# Patient Record
Sex: Male | Born: 1995 | Hispanic: No | Marital: Single | Smoking: Never smoker
Health system: Southern US, Community
[De-identification: ages and names within clinical notes are randomized; demographics above are authoritative.]

## PROBLEM LIST (undated history)

## (undated) DIAGNOSIS — R7303 Prediabetes: Secondary | ICD-10-CM

## (undated) DIAGNOSIS — I10 Essential (primary) hypertension: Secondary | ICD-10-CM

## (undated) DIAGNOSIS — E7800 Pure hypercholesterolemia, unspecified: Secondary | ICD-10-CM

## (undated) HISTORY — PX: APPENDECTOMY: SHX54

---
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
## 2013-04-23 ENCOUNTER — Emergency Department (HOSPITAL_BASED_OUTPATIENT_CLINIC_OR_DEPARTMENT_OTHER)
Admission: EM | Admit: 2013-04-23 | Discharge: 2013-04-23 | Disposition: A | Discharge status: Home / Self Care | Payer: Medicaid Other | Attending: Emergency Medicine | Admitting: Emergency Medicine

## 2013-04-23 ENCOUNTER — Encounter (HOSPITAL_BASED_OUTPATIENT_CLINIC_OR_DEPARTMENT_OTHER): Payer: Self-pay

## 2013-04-23 ENCOUNTER — Emergency Department (HOSPITAL_BASED_OUTPATIENT_CLINIC_OR_DEPARTMENT_OTHER): Payer: Medicaid Other

## 2013-04-23 DIAGNOSIS — I10 Essential (primary) hypertension: Secondary | ICD-10-CM | POA: Insufficient documentation

## 2013-04-23 DIAGNOSIS — R350 Frequency of micturition: Secondary | ICD-10-CM | POA: Insufficient documentation

## 2013-04-23 DIAGNOSIS — K5900 Constipation, unspecified: Secondary | ICD-10-CM | POA: Insufficient documentation

## 2013-04-23 DIAGNOSIS — R109 Unspecified abdominal pain: Secondary | ICD-10-CM | POA: Insufficient documentation

## 2013-04-23 LAB — BASIC METABOLIC PANEL
BUN: 11 mg/dL (ref 6–23)
Chloride: 102 mEq/L (ref 96–112)
GFR calc non Af Amer: >90 mL/min (ref >90)
Glucose, Bld: 153 mg/dL — ABNORMAL HIGH (ref 70–99)
Potassium: 3.8 mEq/L (ref 3.5–5.1)
Sodium: 139 mEq/L (ref 135–145)

## 2013-04-23 LAB — URINALYSIS, ROUTINE W REFLEX MICROSCOPIC
Bilirubin Urine: NEGATIVE
Glucose, UA: NEGATIVE mg/dL
Hgb urine dipstick: NEGATIVE
Specific Gravity, Urine: 1.004 — ABNORMAL LOW (ref 1.005–1.030)
Urobilinogen, UA: 0.2 mg/dL (ref 0.0–1.0)
pH: 5.5 (ref 5.0–8.0)

## 2013-04-23 NOTE — ED Provider Notes (Signed)
CSN: [id number]     Arrival date & time 04/23/13  1603 History     First MD Initiated Contact with Patient 04/23/13 1612     Chief Complaint  Patient presents with  . Abdominal Pain   (Consider location/radiation/quality/duration/timing/severity/associated sxs/prior Treatment) Patient is a 17 y.o. male presenting with abdominal pain. The history is provided by the patient.  Abdominal Pain Pain location:  Suprapubic Pain quality: aching   Pain radiates to:  Does not radiate Pain severity:  Moderate Onset quality:  Gradual Duration:  3 days Timing:  Intermittent Progression:  Worsening Chronicity:  New Relieved by:  None tried Worsened by:  Urination Ineffective treatments:  None tried Associated symptoms: constipation   Associated symptoms: no chills, no diarrhea, no dysuria, no fever, no hematuria, no nausea and no vomiting     Past Medical History  Diagnosis Date  . Hypertension    Past Surgical History  Procedure Laterality Date  . Appendectomy     No family history on file. History  Substance Use Topics  . Smoking status: Never Smoker   . Smokeless tobacco: Not on file  . Alcohol Use: No    Review of Systems  Constitutional: Negative for fever and chills.  Gastrointestinal: Positive for abdominal pain and constipation. Negative for nausea, vomiting and diarrhea.  Genitourinary: Positive for frequency. Negative for dysuria, hematuria, decreased urine volume, discharge, scrotal swelling and penile pain.  Musculoskeletal: Negative for back pain.  All other systems reviewed and are negative.    Allergies  Review of patient's allergies indicates no known allergies.  Home Medications  No current outpatient prescriptions on file. BP 152/73  Pulse 98  Temp(Src) 98.2 F (36.8 C) (Oral)  Resp 18  Ht 6\' 2"  (1.88 m)  Wt 240 lb (108.863 kg)  BMI 30.8 kg/m2  SpO2 99% Physical Exam  Nursing note and vitals reviewed. Constitutional: He is oriented to person,  place, and time. He appears well-developed and well-nourished.  HENT:  Head: Normocephalic and atraumatic.  Right Ear: External ear normal.  Left Ear: External ear normal.  Nose: Nose normal.  Eyes: Right eye exhibits no discharge. Left eye exhibits no discharge.  Neck: Neck supple.  Cardiovascular: Normal rate, regular rhythm, normal heart sounds and intact distal pulses.   Pulmonary/Chest: Effort normal.  Abdominal: Soft. There is tenderness (mild) in the suprapubic area. There is no rebound, no guarding and no CVA tenderness. Hernia confirmed negative in the right inguinal area and confirmed negative in the left inguinal area.  Genitourinary: Testes normal and penis normal. Cremasteric reflex is present. Right testis shows no swelling and no tenderness. Left testis shows no swelling and no tenderness. Circumcised. No penile tenderness.  Musculoskeletal: He exhibits no edema.  Neurological: He is alert and oriented to person, place, and time.  Skin: Skin is warm and dry.    ED Course   Procedures (including critical care time)  Labs Reviewed  URINALYSIS, ROUTINE W REFLEX MICROSCOPIC - Abnormal; Notable for the following:    Specific Gravity, Urine 1.004 (*)    All other components within normal limits  BASIC METABOLIC PANEL - Abnormal; Notable for the following:    Glucose, Bld 153 (*)    All other components within normal limits  GLUCOSE, CAPILLARY - Abnormal; Notable for the following:    Glucose-Capillary 131 (*)    All other components within normal limits   Dg Abd Acute W/chest  04/23/2013   *RADIOLOGY REPORT*  Clinical Data: Abdominal pain  ACUTE  ABDOMEN SERIES (ABDOMEN 2 VIEW & CHEST 1 VIEW)  Comparison: None.  Findings: Normal heart size.  Clear lungs.  No pneumothorax.    C7 cervical ribs are suspected.  There is no free intraperitoneal gas.  No disproportionate dilatation of bowel.  No air fluid levels.  Unremarkable soft tissues.  IMPRESSION: Nonobstructive bowel gas  pattern.  No free intraperitoneal gas.  No active cardiopulmonary disease.   Original Report Authenticated By: Jolaine Click, M.D.   1. Abdominal pain     MDM  Benign abd on exam, no CVA tenderness. No systemic sx. When questioned with mom out of room, patient states he has not had any intercourse or penile discharge. Declined any pain meds in ED. UA shows no infection. GU exam benign. As there is no UTI, labs and xray were evaluated. No signs of kidney injury or diabetes. Patient's abdominal exam remained soft and non-focal. No sign of any infection to treat. He is or his appendix out. No signs of any surgical cause of this pain. We will treat constipation symptomatically and will follow up with PCP in the next one to 2 days if still having pain.  Audree Camel, MD 04/24/13 [phone]

## 2013-04-23 NOTE — ED Notes (Signed)
Pt reports lower mid abdomen pain - points to suprapubic area, that started 3 days ago.  Denies dysuria, n/v or diarrhea.

## 2013-04-23 NOTE — ED Notes (Signed)
D/c home with family- no new rx given 

## 2014-01-07 ENCOUNTER — Emergency Department (HOSPITAL_BASED_OUTPATIENT_CLINIC_OR_DEPARTMENT_OTHER): Payer: BC Managed Care – PPO

## 2014-01-07 ENCOUNTER — Emergency Department (HOSPITAL_BASED_OUTPATIENT_CLINIC_OR_DEPARTMENT_OTHER)
Admission: EM | Admit: 2014-01-07 | Discharge: 2014-01-07 | Disposition: A | Discharge status: Home / Self Care | Payer: BC Managed Care – PPO | Attending: Emergency Medicine | Admitting: Emergency Medicine

## 2014-01-07 ENCOUNTER — Encounter (HOSPITAL_BASED_OUTPATIENT_CLINIC_OR_DEPARTMENT_OTHER): Payer: Self-pay | Admitting: Emergency Medicine

## 2014-01-07 DIAGNOSIS — I10 Essential (primary) hypertension: Secondary | ICD-10-CM

## 2014-01-07 DIAGNOSIS — R0789 Other chest pain: Secondary | ICD-10-CM

## 2014-01-07 DIAGNOSIS — R071 Chest pain on breathing: Secondary | ICD-10-CM | POA: Insufficient documentation

## 2014-01-07 LAB — RAPID URINE DRUG SCREEN, HOSP PERFORMED
AMPHETAMINES: NOT DETECTED
Barbiturates: NOT DETECTED
Benzodiazepines: NOT DETECTED
Cocaine: NOT DETECTED
Opiates: NOT DETECTED
Tetrahydrocannabinol: NOT DETECTED

## 2014-01-07 LAB — CBC WITH DIFFERENTIAL/PLATELET
Basophils Absolute: 0.1 10*3/uL (ref 0.0–0.1)
Basophils Relative: 1 % (ref 0–1)
EOS PCT: 2 % (ref 0–5)
Eosinophils Absolute: 0.2 10*3/uL (ref 0.0–0.7)
HCT: 45.9 % (ref 39.0–52.0)
Hemoglobin: 16.2 g/dL (ref 13.0–17.0)
LYMPHS ABS: 3.7 10*3/uL (ref 0.7–4.0)
LYMPHS PCT: 31 % (ref 12–46)
MCH: 31.2 pg (ref 26.0–34.0)
MCHC: 35.3 g/dL (ref 30.0–36.0)
MCV: 88.4 fL (ref 78.0–100.0)
Monocytes Absolute: 1.3 10*3/uL — ABNORMAL HIGH (ref 0.1–1.0)
Monocytes Relative: 11 % (ref 3–12)
NEUTROS ABS: 6.7 10*3/uL (ref 1.7–7.7)
Neutrophils Relative %: 56 % (ref 43–77)
PLATELETS: 221 10*3/uL (ref 150–400)
RBC: 5.19 MIL/uL (ref 4.22–5.81)
RDW: 12.1 % (ref 11.5–15.5)
WBC: 11.9 10*3/uL — AB (ref 4.0–10.5)

## 2014-01-07 LAB — BASIC METABOLIC PANEL
BUN: 7 mg/dL (ref 6–23)
CALCIUM: 9.8 mg/dL (ref 8.4–10.5)
CHLORIDE: 103 meq/L (ref 96–112)
CO2: 25 meq/L (ref 19–32)
Creatinine, Ser: 0.9 mg/dL (ref 0.50–1.35)
GFR calc Af Amer: >90 mL/min (ref >90)
GFR calc non Af Amer: >90 mL/min (ref >90)
GLUCOSE: 126 mg/dL — AB (ref 70–99)
POTASSIUM: 3.8 meq/L (ref 3.7–5.3)
SODIUM: 142 meq/L (ref 137–147)

## 2014-01-07 LAB — D-DIMER, QUANTITATIVE (NOT AT ARMC)

## 2014-01-07 LAB — TROPONIN I: Troponin I: <0.3 ng/mL (ref <0.30)

## 2014-01-07 MED ORDER — KETOROLAC TROMETHAMINE 60 MG/2ML IM SOLN
60.0000 mg | Freq: Once | INTRAMUSCULAR | Status: AC
  Administered 2014-01-07: 60 mg via INTRAMUSCULAR
  Filled 2014-01-07: qty 2

## 2014-01-07 MED ORDER — GI COCKTAIL ~~LOC~~
30.0000 mL | Freq: Once | ORAL | Status: AC
  Administered 2014-01-07: 30 mL via ORAL
  Filled 2014-01-07: qty 30

## 2014-01-07 NOTE — ED Notes (Signed)
Pt reports laying in bed and feeling pain in his entire chest.  Denies N/V/SOB.

## 2014-01-07 NOTE — Discharge Instructions (Signed)
Chest Wall Pain Chest wall pain is pain in or around the bones and muscles of your chest. It may take up to 6 weeks to get better. It may take longer if you must stay physically active in your work and activities.  CAUSES  Chest wall pain may happen on its own. However, it may be caused by:  A viral illness like the flu.  Injury.  Coughing.  Exercise.  Arthritis.  Fibromyalgia.  Shingles. HOME CARE INSTRUCTIONS   Avoid overtiring physical activity. Try not to strain or perform activities that cause pain. This includes any activities using your chest or your abdominal and side muscles, especially if heavy weights are used.  Put ice on the sore area.  Put ice in a plastic bag.  Place a towel between your skin and the bag.  Leave the ice on for 15-20 minutes per hour while awake for the first 2 days.  Only take over-the-counter or prescription medicines for pain, discomfort, or fever as directed by your caregiver. SEEK IMMEDIATE MEDICAL CARE IF:   Your pain increases, or you are very uncomfortable.  You have a fever.  Your chest pain becomes worse.  You have new, unexplained symptoms.  You have nausea or vomiting.  You feel sweaty or lightheaded.  You have a cough with phlegm (sputum), or you cough up blood. MAKE SURE YOU:   Understand these instructions.  Will watch your condition.  Will get help right away if you are not doing well or get worse. Document Released: 08/29/2005 Document Revised: 11/21/2011 Document Reviewed: 04/25/2011 [facility] Patient Information 2014 [place], [place].  Arterial Hypertension Arterial hypertension (high blood pressure) is a condition of elevated pressure in your blood vessels. Hypertension over a long period of time is a risk factor for strokes, heart attacks, and heart failure. It is also the leading cause of kidney (renal) failure.  CAUSES   In Adults -- Over 90% of all hypertension has no known cause. This is called essential  or primary hypertension. In the other 10% of people with hypertension, the increase in blood pressure is caused by another disorder. This is called secondary hypertension. Important causes of secondary hypertension are:  Heavy alcohol use.  Obstructive sleep apnea.  Hyperaldosterosim (Conn's syndrome).  Steroid use.  Chronic kidney failure.  Hyperparathyroidism.  Medications.  Renal artery stenosis.  Pheochromocytoma.  Cushing's disease.  Coarctation of the aorta.  Scleroderma renal crisis.  Licorice (in excessive amounts).  Drugs (cocaine, methamphetamine). Your caregiver can explain any items above that apply to you.  In Children -- Secondary hypertension is more common and should always be considered.  Pregnancy -- Few women of childbearing age have high blood pressure. However, up to 10% of them develop hypertension of pregnancy. Generally, this will not harm the woman. It may be a sign of 3 complications of pregnancy: preeclampsia, HELLP syndrome, and eclampsia. Follow up and control with medication is necessary. SYMPTOMS   This condition normally does not produce any noticeable symptoms. It is usually found during a routine exam.  Malignant hypertension is a late problem of high blood pressure. It may have the following symptoms:  Headaches.  Blurred vision.  End-organ damage (this means your kidneys, heart, lungs, and other organs are being damaged).  Stressful situations can increase the blood pressure. If a person with normal blood pressure has their blood pressure go up while being seen by their caregiver, this is often termed "white coat hypertension." Its importance is not known. It may be  related with eventually developing hypertension or complications of hypertension.  Hypertension is often confused with mental tension, stress, and anxiety. DIAGNOSIS  The diagnosis is made by 3 separate blood pressure measurements. They are taken at least 1 week apart  from each other. If there is organ damage from hypertension, the diagnosis may be made without repeat measurements. Hypertension is usually identified by having blood pressure readings:  Above 140/90 mmHg measured in both arms, at 3 separate times, over a couple weeks.  Over 130/80 mmHg should be considered a risk factor and may require treatment in patients with diabetes. Blood pressure readings over 120/80 mmHg are called "pre-hypertension" even in non-diabetic patients. To get a true blood pressure measurement, use the following guidelines. Be aware of the factors that can alter blood pressure readings.  Take measurements at least 1 hour after caffeine.  Take measurements 30 minutes after smoking and without any stress. This is another reason to quit smoking  it raises your blood pressure.  Use a proper cuff size. Ask your caregiver if you are not sure about your cuff size.  Most home blood pressure cuffs are automatic. They will measure systolic and diastolic pressures. The systolic pressure is the pressure reading at the start of sounds. Diastolic pressure is the pressure at which the sounds disappear. If you are elderly, measure pressures in multiple postures. Try sitting, lying or standing.  Sit at rest for a minimum of 5 minutes before taking measurements.  You should not be on any medications like decongestants. These are found in many cold medications.  Record your blood pressure readings and review them with your caregiver. If you have hypertension:  Your caregiver may do tests to be sure you do not have secondary hypertension (see "causes" above).  Your caregiver may also look for signs of metabolic syndrome. This is also called Syndrome X or Insulin Resistance Syndrome. You may have this syndrome if you have type 2 diabetes, abdominal obesity, and abnormal blood lipids in addition to hypertension.  Your caregiver will take your medical and family history and perform a  physical exam.  Diagnostic tests may include blood tests (for glucose, cholesterol, potassium, and kidney function), a urinalysis, or an EKG. Other tests may also be necessary depending on your condition. PREVENTION  There are important lifestyle issues that you can adopt to reduce your chance of developing hypertension:  Maintain a normal weight.  Limit the amount of salt (sodium) in your diet.  Exercise often.  Limit alcohol intake.  Get enough potassium in your diet. Discuss specific advice with your caregiver.  Follow a DASH diet (dietary approaches to stop hypertension). This diet is rich in fruits, vegetables, and low-fat dairy products, and avoids certain fats. PROGNOSIS  Essential hypertension cannot be cured. Lifestyle changes and medical treatment can lower blood pressure and reduce complications. The prognosis of secondary hypertension depends on the underlying cause. Many people whose hypertension is controlled with medicine or lifestyle changes can live a normal, healthy life.  RISKS AND COMPLICATIONS  While high blood pressure alone is not an illness, it often requires treatment due to its short- and long-term effects on many organs. Hypertension increases your risk for:  CVAs or strokes (cerebrovascular accident).  Heart failure due to chronically high blood pressure (hypertensive cardiomyopathy).  Heart attack (myocardial infarction).  Damage to the retina (hypertensive retinopathy).  Kidney failure (hypertensive nephropathy). Your caregiver can explain list items above that apply to you. Treatment of hypertension can significantly reduce the risk  of complications. TREATMENT   For overweight patients, weight loss and regular exercise are recommended. Physical fitness lowers blood pressure.  Mild hypertension is usually treated with diet and exercise. A diet rich in fruits and vegetables, fat-free dairy products, and foods low in fat and salt (sodium) can help lower  blood pressure. Decreasing salt intake decreases blood pressure in a 1/3 of people.  Stop smoking if you are a smoker. The steps above are highly effective in reducing blood pressure. While these actions are easy to suggest, they are difficult to achieve. Most patients with moderate or severe hypertension end up requiring medications to bring their blood pressure down to a normal level. There are several classes of medications for treatment. Blood pressure pills (antihypertensives) will lower blood pressure by their different actions. Lowering the blood pressure by 10 mmHg may decrease the risk of complications by as much as 25%. The goal of treatment is effective blood pressure control. This will reduce your risk for complications. Your caregiver will help you determine the best treatment for you according to your lifestyle. What is excellent treatment for one person, may not be for you. HOME CARE INSTRUCTIONS   Do not smoke.  Follow the lifestyle changes outlined in the "Prevention" section.  If you are on medications, follow the directions carefully. Blood pressure medications must be taken as prescribed. Skipping doses reduces their benefit. It also puts you at risk for problems.  Follow up with your caregiver, as directed.  If you are asked to monitor your blood pressure at home, follow the guidelines in the "Diagnosis" section above. SEEK MEDICAL CARE IF:   You think you are having medication side effects.  You have recurrent headaches or lightheadedness.  You have swelling in your ankles.  You have trouble with your vision. SEEK IMMEDIATE MEDICAL CARE IF:   You have sudden onset of chest pain or pressure, difficulty breathing, or other symptoms of a heart attack.  You have a severe headache.  You have symptoms of a stroke (such as sudden weakness, difficulty speaking, difficulty walking). MAKE SURE YOU:   Understand these instructions.  Will watch your condition.  Will  get help right away if you are not doing well or get worse. Document Released: 08/29/2005 Document Revised: 11/21/2011 Document Reviewed: 03/29/2007 [facility] Patient Information 2014 [place]ExitCare, [place]LL[place].

## 2014-01-07 NOTE — ED Provider Notes (Signed)
CSN: [id number]     Arrival date & time 01/07/14  0348 History   First MD Initiated Contact with Patient 01/07/14 [phone]     Chief Complaint  Patient presents with  . Chest Pain     (Consider location/radiation/quality/duration/timing/severity/associated sxs/prior Treatment) Patient is a 18 y.o. male presenting with chest pain. The history is provided by the patient.  Chest Pain Pain quality: dull   Pain radiates to:  Does not radiate Pain radiates to the back: no   Pain severity:  Mild Onset quality:  Gradual Timing:  Constant Progression:  Unchanged Chronicity:  New Context: not breathing, no stress and no trauma   Relieved by:  Nothing Worsened by:  Nothing tried Associated symptoms: no abdominal pain, no altered mental status, no back pain, no cough, no diaphoresis, no fever, no lower extremity edema, no nausea, no numbness, no orthopnea, no palpitations, no PND, no shortness of breath, no syncope and not vomiting   Risk factors: no immobilization, no prior DVT/PE and no surgery   No swelling or pain in the lower extremities.  No long car trips or plane trips.  No DOE no SOB.  Ate a pop tart before symptoms started  Past Medical History  Diagnosis Date  . Hypertension    Past Surgical History  Procedure Laterality Date  . Appendectomy     History reviewed. No pertinent family history. History  Substance Use Topics  . Smoking status: Never Smoker   . Smokeless tobacco: Not on file  . Alcohol Use: No    Review of Systems  Constitutional: Negative for fever and diaphoresis.  Respiratory: Negative for cough and shortness of breath.   Cardiovascular: Positive for chest pain. Negative for palpitations, orthopnea, leg swelling, syncope and PND.  Gastrointestinal: Negative for nausea, vomiting and abdominal pain.  Musculoskeletal: Negative for back pain.  Neurological: Negative for numbness.  All other systems reviewed and are negative.     Allergies  Review of  patient's allergies indicates no known allergies.  Home Medications   Prior to Admission medications   Not on File   BP 156/81  Pulse 96  Temp(Src) 98.2 F (36.8 C) (Oral)  Resp 20  Ht 6\' 1"  (1.854 m)  Wt 275 lb (124.739 kg)  BMI 36.29 kg/m2  SpO2 97% Physical Exam  Constitutional: He is oriented to person, place, and time. He appears well-developed and well-nourished. No distress.  HENT:  Head: Normocephalic and atraumatic.  Mouth/Throat: Oropharynx is clear and moist.  Eyes: Conjunctivae are normal. Pupils are equal, round, and reactive to light.  Neck: Normal range of motion. Neck supple.  Cardiovascular: Normal rate, regular rhythm and intact distal pulses.   Pulmonary/Chest: Effort normal and breath sounds normal. No respiratory distress. He has no wheezes. He has no rales. He exhibits no tenderness.  Abdominal: Soft. Bowel sounds are normal. There is no tenderness. There is no rebound and no guarding.  Musculoskeletal: Normal range of motion.  Neurological: He is alert and oriented to person, place, and time.  Skin: Skin is warm and dry. He is not diaphoretic.  Psychiatric: He has a normal mood and affect.    ED Course  Procedures (including critical care time) Labs Review Labs Reviewed - No data to display  Imaging Review No results found.   EKG Interpretation   Date/Time:  Tuesday Wylene Weissman 28 2015 03:59:43 EDT Ventricular Rate:  91 PR Interval:  162 QRS Duration: 104 QT Interval:  354 QTC Calculation: 435 R Axis:  62 Text Interpretation:  Normal sinus rhythm Confirmed by [facility]way[facility]UM[facility]RASCH  MD,  Krysia Zahradnik ([id number]) on 01/07/2014 4:03:11 AM      MDM   Final diagnoses:  None    Chest wall pain:  Pain is clearly non cardiac but patient has HTN and will need to follow up for medication planning    Brian Zeitlin K Derisha Funderburke-Rasch, MD 01/07/14 [phone]

## 2014-01-29 ENCOUNTER — Ambulatory Visit (INDEPENDENT_AMBULATORY_CARE_PROVIDER_SITE_OTHER): Payer: Medicaid Other | Admitting: Cardiology

## 2014-01-29 ENCOUNTER — Encounter: Payer: Self-pay | Admitting: Cardiology

## 2014-01-29 VITALS — BP 142/84 | Ht 72.0 in | Wt 266.4 lb

## 2014-01-29 DIAGNOSIS — R079 Chest pain, unspecified: Secondary | ICD-10-CM

## 2014-01-29 DIAGNOSIS — R918 Other nonspecific abnormal finding of lung field: Secondary | ICD-10-CM

## 2014-01-29 DIAGNOSIS — R9389 Abnormal findings on diagnostic imaging of other specified body structures: Secondary | ICD-10-CM

## 2014-01-29 NOTE — Progress Notes (Signed)
HPI The patient presents for evaluation of chest discomfort. He was seen in the emergency room in late April for this. I reviewed his records. This was felt to be probably musculoskeletal or chest wall pain. EKG demonstrated no acute abnormalities.  He did have a mildly elevated white blood cell count with a normal d-dimer.  He was also noted to have possible mild vascular congestion and cardiomegaly on chest x-ray. The patient does have a history of hypertension and borderline hyperglycemia almost definitely related to his obesity and inactivity. He said he was having chest pain that day he presented. He said he was not dull or sharp.  There was no radiation to his jaw or to his arms. The left upper chest discomfort. It started at rest and soft after about 2 hours. He did get a GI cocktail and Toradol in the emergency room. He had some associated mild nausea. He said he felt hot area in he doesn't remember if he had overt shortness of breath. The pain was 7/10 in intensity.   He has not had this kind of discomfort before or since. He denies any ongoing shortness of breath, PND or orthopnea. He denies any ongoing chest pressure, neck or arm discomfort.   No Known Allergies  No current outpatient prescriptions on file.   No current facility-administered medications for this visit.    Past Medical History  Diagnosis Date  . Asthma   . Hypertension   . Hypercholesteremia   . Prediabetes     Past Surgical History  Procedure Laterality Date  . Appendectomy      Family History  Problem Relation Age of Onset  . Hypertension Maternal Grandmother   . Atrial fibrillation Maternal Grandmother     History   Social History  . Marital Status: Single    Spouse Name: N/A    Number of Children: N/A  . Years of Education: N/A   Occupational History  . Not on file.   Social History Main Topics  . Smoking status: Never Smoker   . Smokeless tobacco: Never Used  . Alcohol Use: No  . Drug  Use: No  . Sexual Activity:    Other Topics Concern  . Not on file   Social History Narrative   Lives at home with mom.     ROS:As stated in the HPI and negative for all other systems.  PHYSICAL EXAM BP 142/84  Ht 6' (1.829 m)  Wt 266 lb 6.4 oz (120.838 kg)  BMI 36.12 kg/m2 GENERAL:  Well appearing HEENT:  Pupils equal round and reactive, fundi not visualized, oral mucosa unremarkable NECK:  No jugular venous distention, waveform within normal limits, carotid upstroke brisk and symmetric, no bruits, no thyromegaly LYMPHATICS:  No cervical, inguinal adenopathy LUNGS:  Clear to auscultation bilaterally BACK:  No CVA tenderness CHEST:  Unremarkable HEART:  PMI not displaced or sustained,S1 and S2 within normal limits, no S3, no S4, no clicks, no rubs, no murmurs ABD:  Flat, positive bowel sounds normal in frequency in pitch, no bruits, no rebound, no guarding, no midline pulsatile mass, no hepatomegaly, no splenomegaly EXT:  2 plus pulses throughout, no edema, no cyanosis no clubbing SKIN:  No rashes no nodules NEURO:  Cranial nerves II through XII grossly intact, motor grossly intact throughout PSYCH:  Cognitively intact, oriented to person place and time   EKG:   Sinus rhythm, rate 91, axis within normal limits, intervals within normal limits, no acute ST-T wave changes. 01/07/14  ASSESSMENT AND PLAN  ABNORMAL CXR:  There is no clinical indication to his heart failure. Has no symptoms consistent with this. No further cardiovascular testing is suggested.  CHEST PAIN:  This was atypical. The pretest probability of obstructive coronary disease is very low. No further cardiovascular testing is suggested. We did discuss primary risk reduction as below.  OBESITY:  The patient understands the need to lose weight with diet and exercise. We have discussed specific strategies for this.  HTN:  We discussed the need for weight loss as a treatment of this.

## 2014-01-29 NOTE — Patient Instructions (Signed)
Follow up as needed.  Increase exercise to 30 minutes 5 days a week.

## 2014-02-04 DIAGNOSIS — R9389 Abnormal findings on diagnostic imaging of other specified body structures: Secondary | ICD-10-CM | POA: Insufficient documentation

## 2014-02-04 DIAGNOSIS — R079 Chest pain, unspecified: Secondary | ICD-10-CM | POA: Insufficient documentation

## 2014-12-05 IMAGING — CR DG ABDOMEN ACUTE W/ 1V CHEST
3 series · 3 of 3 positions shown · non-contrast
Comparison: None.

CLINICAL DATA: Abdominal pain

ACUTE ABDOMEN SERIES (ABDOMEN 2 VIEW & CHEST 1 VIEW)

[w chest pa]
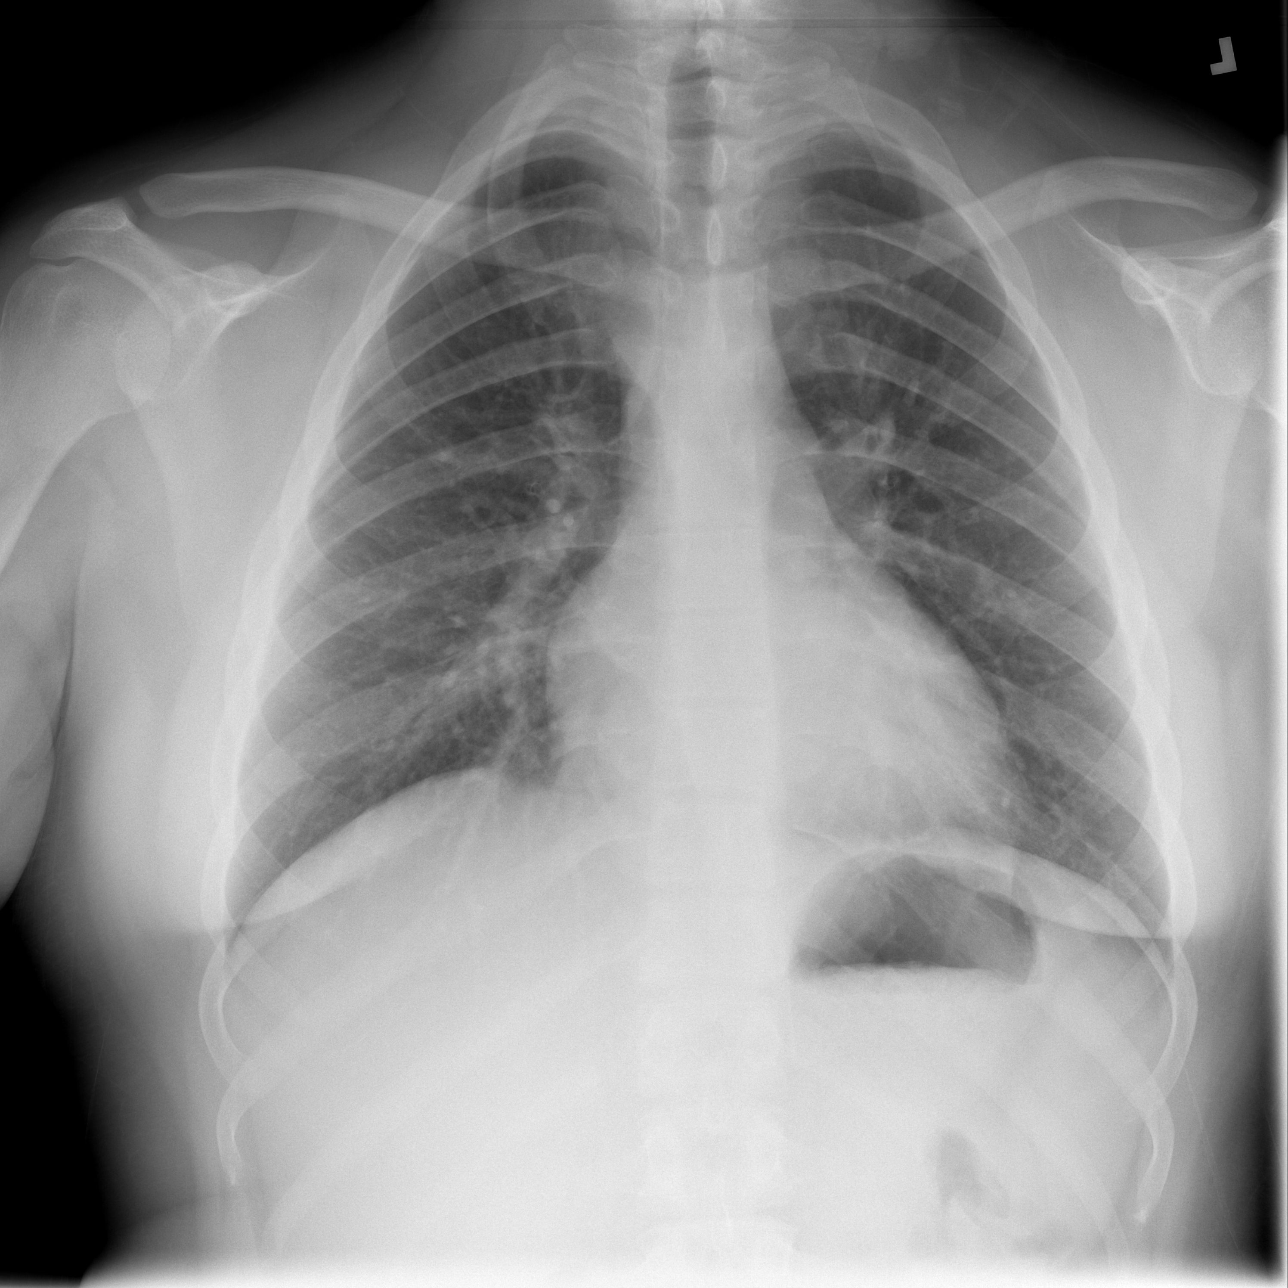

[w abdomen upright]
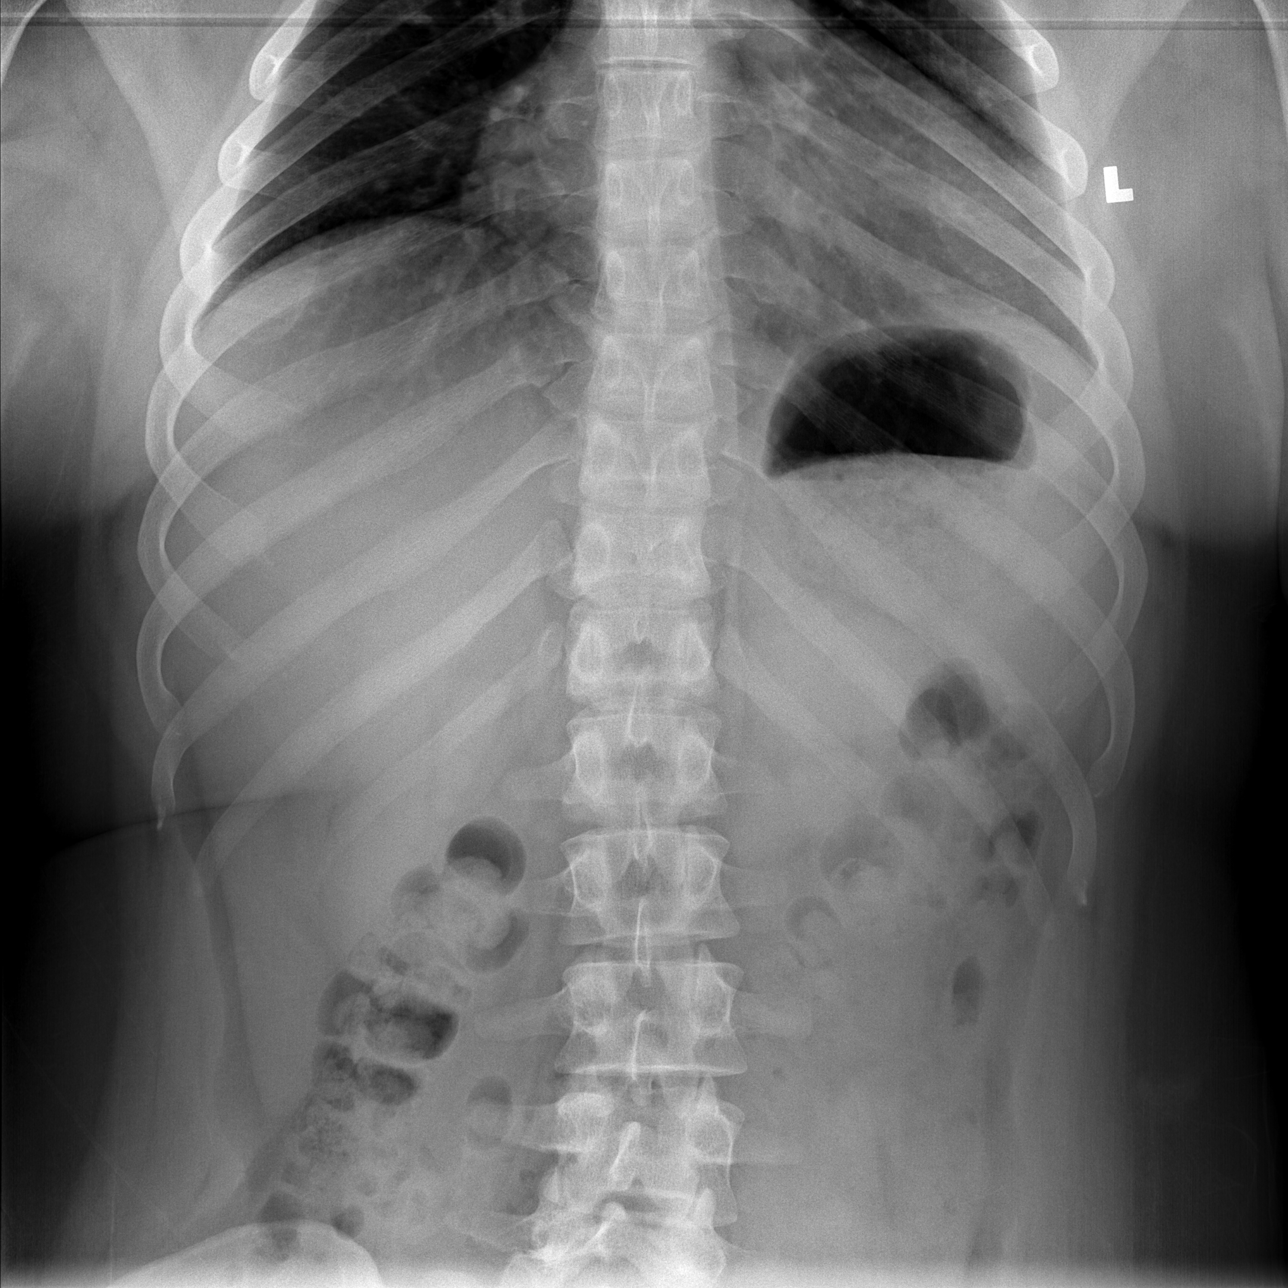

[t abdomen supine]
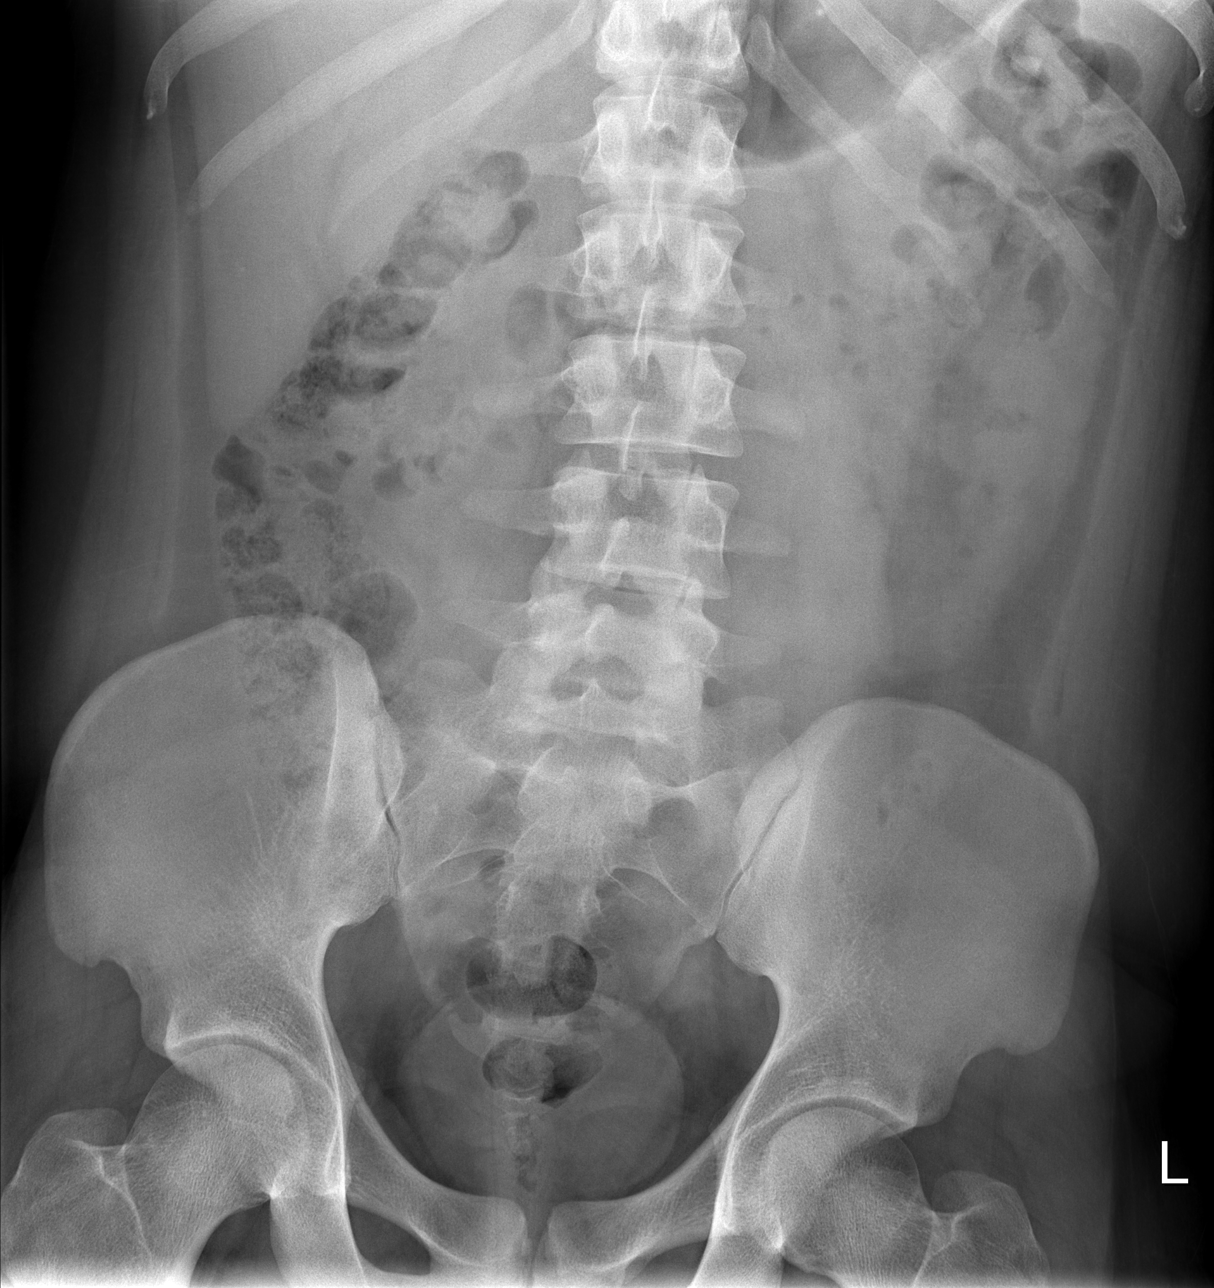

[3 of 3 positions shown; findings below may reference images not displayed]

FINDINGS: Normal heart size.  Clear lungs.  No pneumothorax.    C7
cervical ribs are suspected.

There is no free intraperitoneal gas.  No disproportionate
dilatation of bowel.  No air fluid levels.  Unremarkable soft
tissues.
IMPRESSION: Nonobstructive bowel gas pattern.  No free intraperitoneal gas.  No
active cardiopulmonary disease.

## 2015-08-21 IMAGING — CR DG CHEST 2V
2 series · 2 of 2 positions shown · non-contrast
Comparison: Chest radiograph performed 04/23/2013

CLINICAL DATA: Chest tightness.

EXAM:
CHEST  2 VIEW

[w chest pa]
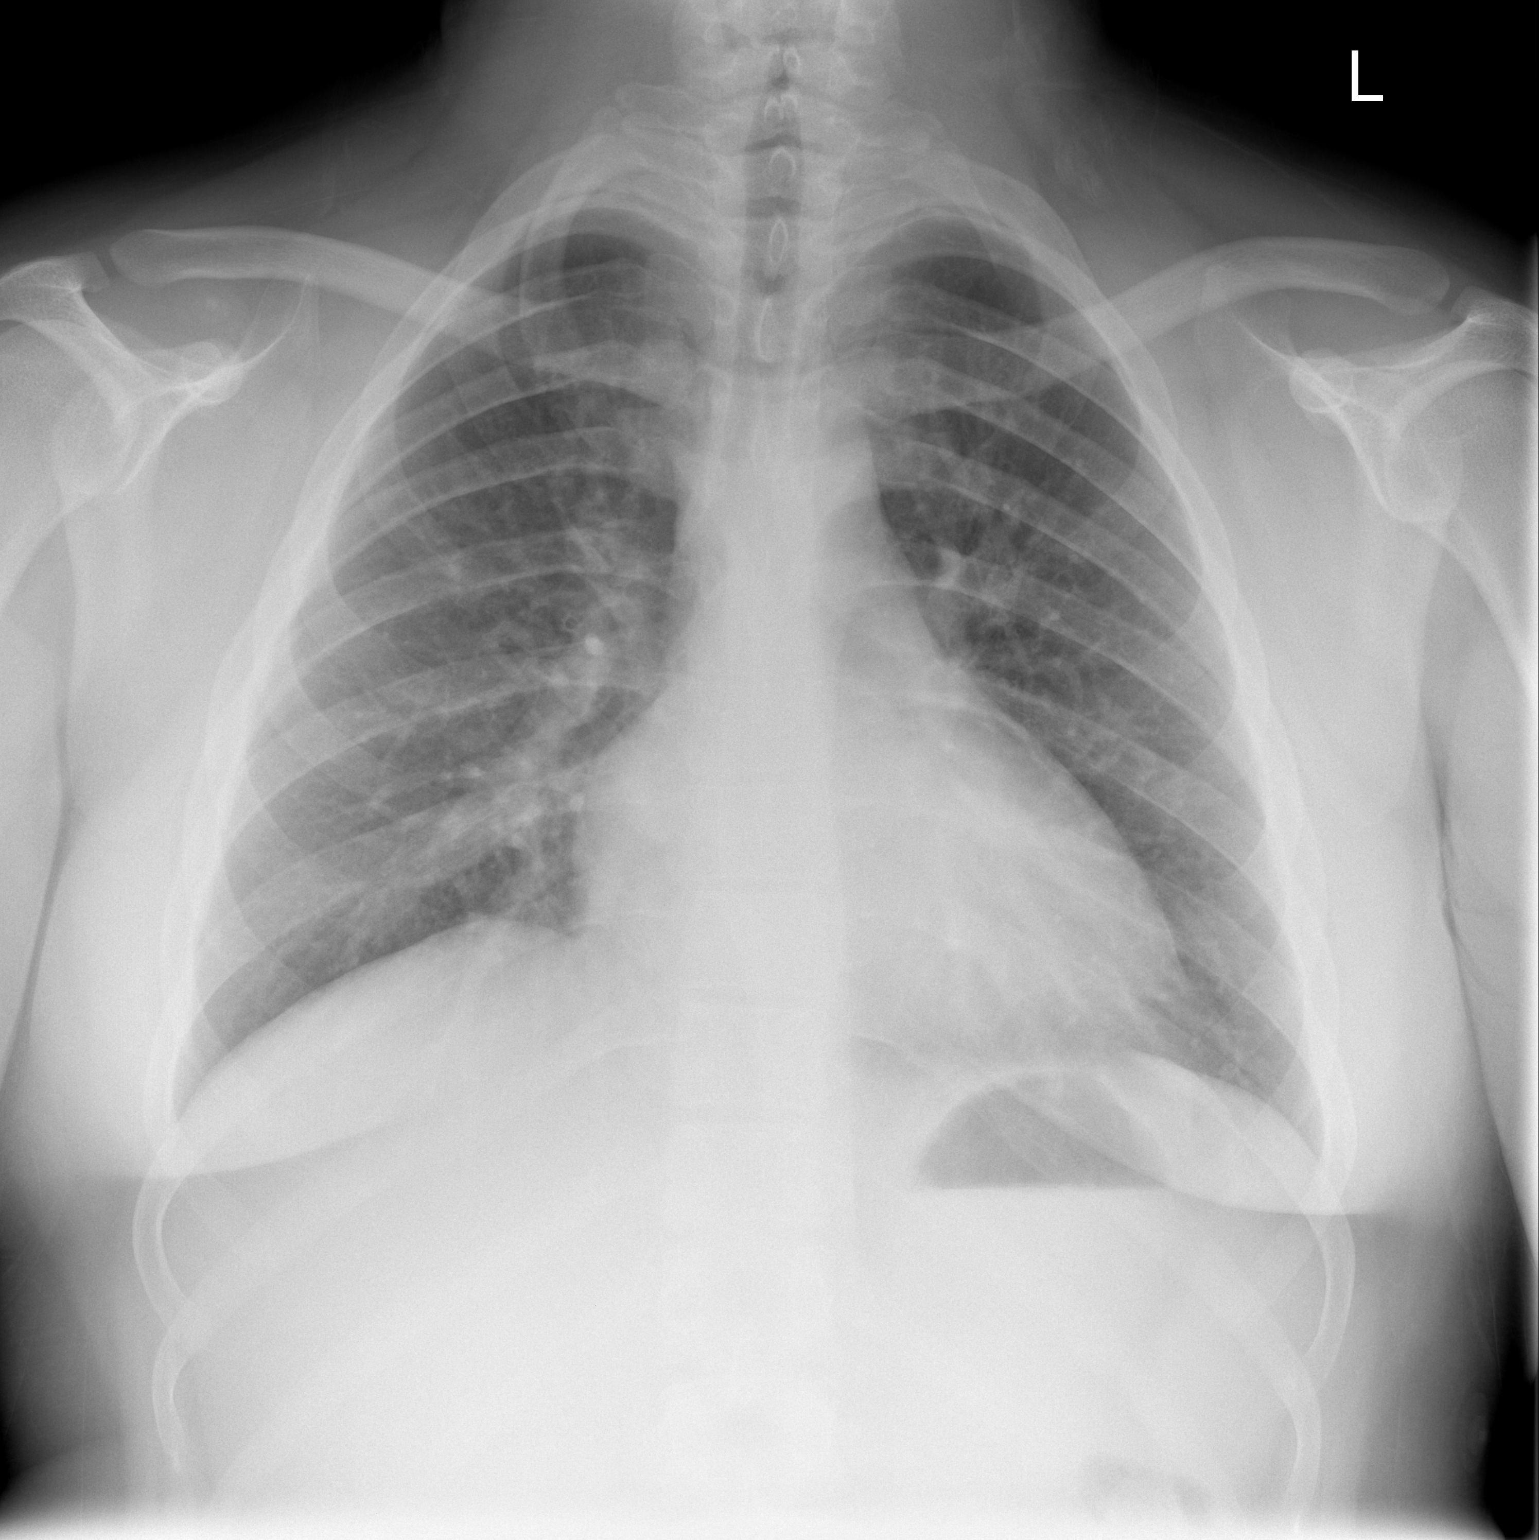

[w chest lat]
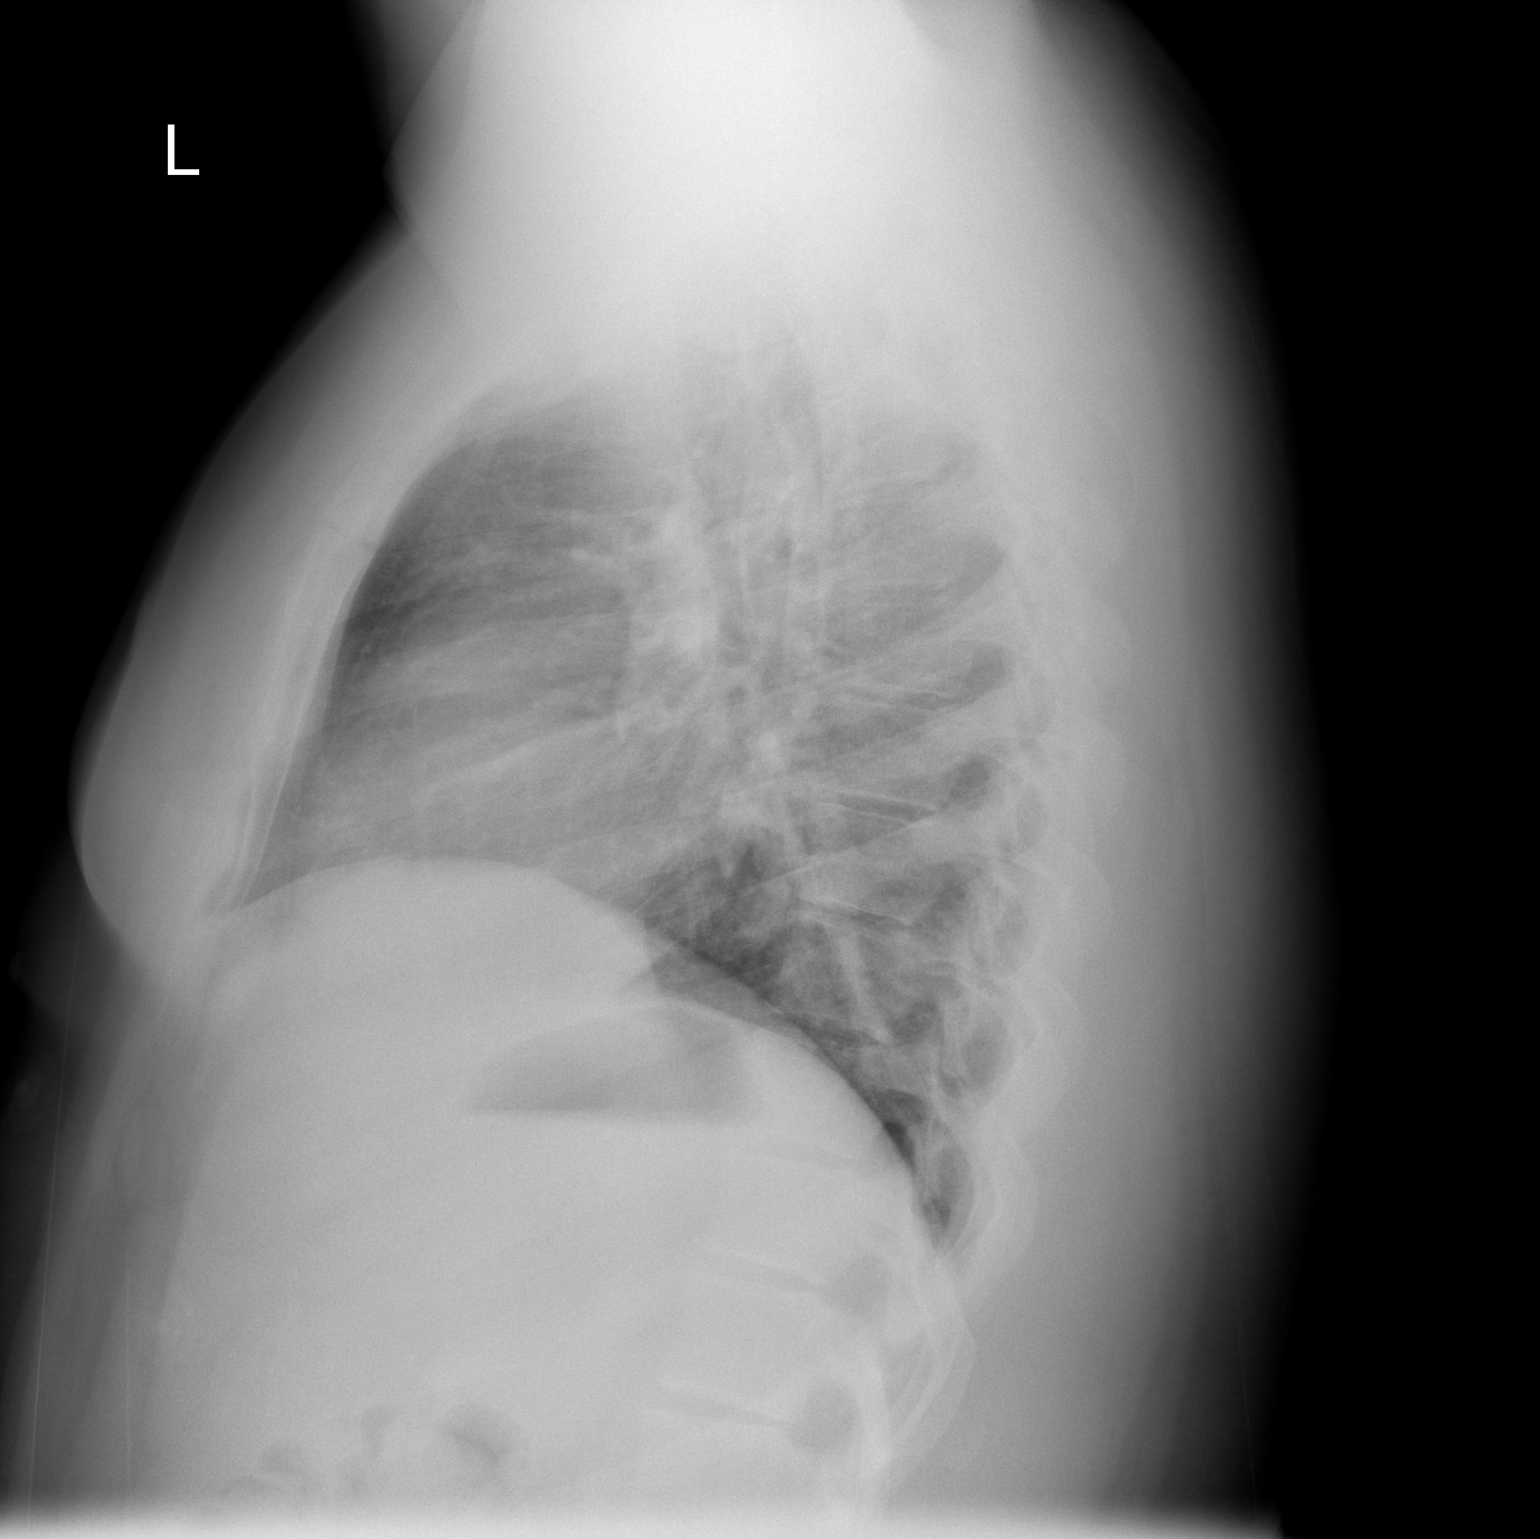

[2 of 2 positions shown; findings below may reference images not displayed]

FINDINGS: The lungs are well-aerated. Mild peribronchial thickening is noted.
Mild vascular congestion is seen. There is no evidence of pleural
effusion or pneumothorax.

The heart is borderline normal in size; the mediastinal contour is
within normal limits. No acute osseous abnormalities are seen.
IMPRESSION: Mild vascular congestion noted. Mild peribronchial thickening is
seen. Lungs otherwise clear.

## 2016-04-14 ENCOUNTER — Encounter (HOSPITAL_BASED_OUTPATIENT_CLINIC_OR_DEPARTMENT_OTHER): Payer: Self-pay | Admitting: *Deleted

## 2016-04-14 ENCOUNTER — Emergency Department (HOSPITAL_BASED_OUTPATIENT_CLINIC_OR_DEPARTMENT_OTHER)
Admission: EM | Admit: 2016-04-14 | Discharge: 2016-04-14 | Disposition: A | Discharge status: Home / Self Care | Payer: Medicaid Other | Attending: Emergency Medicine | Admitting: Emergency Medicine

## 2016-04-14 DIAGNOSIS — Y999 Unspecified external cause status: Secondary | ICD-10-CM | POA: Insufficient documentation

## 2016-04-14 DIAGNOSIS — Y929 Unspecified place or not applicable: Secondary | ICD-10-CM | POA: Insufficient documentation

## 2016-04-14 DIAGNOSIS — X58XXXA Exposure to other specified factors, initial encounter: Secondary | ICD-10-CM | POA: Insufficient documentation

## 2016-04-14 DIAGNOSIS — Z7722 Contact with and (suspected) exposure to environmental tobacco smoke (acute) (chronic): Secondary | ICD-10-CM | POA: Insufficient documentation

## 2016-04-14 DIAGNOSIS — S39011A Strain of muscle, fascia and tendon of abdomen, initial encounter: Secondary | ICD-10-CM | POA: Insufficient documentation

## 2016-04-14 DIAGNOSIS — J45909 Unspecified asthma, uncomplicated: Secondary | ICD-10-CM | POA: Insufficient documentation

## 2016-04-14 DIAGNOSIS — I10 Essential (primary) hypertension: Secondary | ICD-10-CM | POA: Insufficient documentation

## 2016-04-14 DIAGNOSIS — Y939 Activity, unspecified: Secondary | ICD-10-CM | POA: Insufficient documentation

## 2016-04-14 LAB — URINALYSIS, ROUTINE W REFLEX MICROSCOPIC
Bilirubin Urine: NEGATIVE
Glucose, UA: NEGATIVE mg/dL
Hgb urine dipstick: NEGATIVE
KETONES UR: NEGATIVE mg/dL
LEUKOCYTES UA: NEGATIVE
NITRITE: NEGATIVE
PROTEIN: NEGATIVE mg/dL
Specific Gravity, Urine: 1.022 (ref 1.005–1.030)
pH: 7.5 (ref 5.0–8.0)

## 2016-04-14 MED ORDER — DIAZEPAM 5 MG PO TABS: 5.0000 mg | ORAL_TABLET | Freq: Two times a day (BID) | ORAL | 0 refills | Status: AC | PRN

## 2016-04-14 NOTE — ED Notes (Signed)
Dr Clydene Pugh in room with patient now.

## 2016-04-14 NOTE — ED Notes (Signed)
Pt teaching provided on medications that may cause drowsiness. Pt instructed not to drive or operate heavy machinery while taking the prescribed medication. Pt verbalized understanding.   

## 2016-04-14 NOTE — ED Provider Notes (Signed)
[facility] Provider Note   CSN: [id number] Arrival date & time: 04/14/16  1722  First Provider Contact:  First MD Initiated Contact with Patient 04/14/16 1736        History   Chief Complaint Chief Complaint  Patient presents with  . Abdominal Pain    HPI TIMOTEO CARREIRO is a 20 y.o. male.  The history is provided by the patient.  Abdominal Pain   This is a new problem. The current episode started 6 to 12 hours ago. The problem occurs constantly. The problem has not changed since onset.Associated with: woke up from sleep. The pain is located in the LUQ (left flank). The quality of the pain is sharp and colicky. The pain is moderate. Pertinent negatives include vomiting, dysuria and frequency. Nothing aggravates the symptoms. The symptoms are relieved by NSAIDs.    Past Medical History:  Diagnosis Date  . Asthma   . Hypercholesteremia   . Hypertension   . Prediabetes     Patient Active Problem List   Diagnosis Date Noted  . Chest pain 02/04/2014  . Abnormal chest x-ray 02/04/2014    Past Surgical History:  Procedure Laterality Date  . APPENDECTOMY         Home Medications    Prior to Admission medications   Not on File    Family History Family History  Problem Relation Age of Onset  . Hypertension Maternal Grandmother   . Atrial fibrillation Maternal Grandmother     Social History Social History  Substance Use Topics  . Smoking status: Passive Smoke Exposure - Never Smoker  . Smokeless tobacco: Never Used  . Alcohol use No     Allergies   Review of patient's allergies indicates no known allergies.   Review of Systems Review of Systems  Gastrointestinal: Positive for abdominal pain. Negative for vomiting.  Genitourinary: Negative for dysuria and frequency.  All other systems reviewed and are negative.    Physical Exam Updated Vital Signs BP 146/76   Pulse 87   Temp 98.1 F (36.7 C)   Resp 20   Ht  (1.854 m)   Wt  283 lb (128.4 kg)   SpO2 96%   BMI 37.34 kg/m   Physical Exam  Constitutional: He is oriented to person, place, and time. He appears well-developed and well-nourished. No distress.  HENT:  Head: Normocephalic and atraumatic.  Eyes: Conjunctivae are normal.  Neck: Neck supple. No tracheal deviation present.  Cardiovascular: Normal rate, regular rhythm and normal heart sounds.   Pulmonary/Chest: Effort normal and breath sounds normal. No respiratory distress.  Abdominal: Soft. He exhibits no distension.  Point tender over left upper quadrant oblique area without guarding, rebound, or referred abdominal tenderness  Neurological: He is alert and oriented to person, place, and time.  Skin: Skin is warm and dry.  Psychiatric: He has a normal mood and affect.     ED Treatments / Results  Labs (all labs ordered are listed, but only abnormal results are displayed) Labs Reviewed  URINALYSIS, ROUTINE W REFLEX MICROSCOPIC (NOT AT [facility])    EKG  EKG Interpretation None       Radiology No results found.  Procedures Procedures (including critical care time)  Medications Ordered in ED Medications - No data to display   Initial Impression / Assessment and Plan / ED Course  I have reviewed the triage vital signs and the nursing notes.  Pertinent labs & imaging results that were available during my care of  the patient were reviewed by me and considered in my medical decision making (see chart for details).  Clinical Course    20 year old otherwise healthy male presents with left upper quadrant and left flank pain that started when he woke up this morning. It seems to be exacerbated by certain movements. It has been coming and going intermittently over the course of the day. There is no hematuria on urinalysis and despite location I feel that nephrolithiasis is unlikely given the exam findings more consistent with a muscular etiology. Bedside ultrasound without hydronephrosis further  decreases the likelihood of a stone.  I discussed with the patient care of abdominal muscle spasm and pain and provided him with a short course of benzos to help alleviate. I also discussed the limited workup today requiring him to return immediately with any worsening or new symptoms.  Final Clinical Impressions(s) / ED Diagnoses   Final diagnoses:  Abdominal muscle strain, initial encounter    New Prescriptions New Prescriptions   DIAZEPAM (VALIUM) 5 MG TABLET    Take 1 tablet (5 mg total) by mouth every 12 (twelve) hours as needed for muscle spasms.     Lyndal Pulley, MD 04/14/16 [phone]

## 2016-04-14 NOTE — ED Triage Notes (Signed)
Pt c/o left lower abd pain x 12 hrs denies n/v/d

## 2016-04-15 ENCOUNTER — Encounter (HOSPITAL_BASED_OUTPATIENT_CLINIC_OR_DEPARTMENT_OTHER): Payer: Self-pay | Admitting: *Deleted

## 2016-04-15 LAB — URINE CULTURE: Culture: NO GROWTH

## 2021-05-06 ENCOUNTER — Encounter (HOSPITAL_BASED_OUTPATIENT_CLINIC_OR_DEPARTMENT_OTHER): Payer: Self-pay

## 2021-05-06 ENCOUNTER — Other Ambulatory Visit: Payer: Self-pay

## 2021-05-06 ENCOUNTER — Emergency Department (HOSPITAL_BASED_OUTPATIENT_CLINIC_OR_DEPARTMENT_OTHER)
Admission: EM | Admit: 2021-05-06 | Discharge: 2021-05-06 | Disposition: A | Discharge status: Home / Self Care | Payer: Self-pay | Attending: Emergency Medicine | Admitting: Emergency Medicine

## 2021-05-06 DIAGNOSIS — R55 Syncope and collapse: Secondary | ICD-10-CM | POA: Insufficient documentation

## 2021-05-06 DIAGNOSIS — I10 Essential (primary) hypertension: Secondary | ICD-10-CM | POA: Insufficient documentation

## 2021-05-06 DIAGNOSIS — R1013 Epigastric pain: Secondary | ICD-10-CM | POA: Insufficient documentation

## 2021-05-06 DIAGNOSIS — R42 Dizziness and giddiness: Secondary | ICD-10-CM | POA: Insufficient documentation

## 2021-05-06 DIAGNOSIS — J45909 Unspecified asthma, uncomplicated: Secondary | ICD-10-CM | POA: Insufficient documentation

## 2021-05-06 DIAGNOSIS — R197 Diarrhea, unspecified: Secondary | ICD-10-CM | POA: Insufficient documentation

## 2021-05-06 LAB — URINALYSIS, ROUTINE W REFLEX MICROSCOPIC
Bilirubin Urine: NEGATIVE
Glucose, UA: NEGATIVE mg/dL
Hgb urine dipstick: NEGATIVE
Ketones, ur: NEGATIVE mg/dL
Leukocytes,Ua: NEGATIVE
Nitrite: NEGATIVE
Protein, ur: NEGATIVE mg/dL
Specific Gravity, Urine: 1.03 (ref 1.005–1.030)
pH: 5 (ref 5.0–8.0)

## 2021-05-06 LAB — CBC
HCT: 45.7 % (ref 39.0–52.0)
Hemoglobin: 15.4 g/dL (ref 13.0–17.0)
MCH: 29.5 pg (ref 26.0–34.0)
MCHC: 33.7 g/dL (ref 30.0–36.0)
MCV: 87.5 fL (ref 80.0–100.0)
Platelets: 207 10*3/uL (ref 150–400)
RBC: 5.22 MIL/uL (ref 4.22–5.81)
RDW: 12.4 % (ref 11.5–15.5)
WBC: 9.7 10*3/uL (ref 4.0–10.5)
nRBC: 0 % (ref 0.0–0.2)

## 2021-05-06 LAB — COMPREHENSIVE METABOLIC PANEL
ALT: 37 U/L (ref 0–44)
AST: 25 U/L (ref 15–41)
Albumin: 4.3 g/dL (ref 3.5–5.0)
Alkaline Phosphatase: 93 U/L (ref 38–126)
Anion gap: 10 (ref 5–15)
BUN: 10 mg/dL (ref 6–20)
CO2: 23 mmol/L (ref 22–32)
Calcium: 8.8 mg/dL — ABNORMAL LOW (ref 8.9–10.3)
Chloride: 104 mmol/L (ref 98–111)
Creatinine, Ser: 1.09 mg/dL (ref 0.61–1.24)
GFR, Estimated: >60 mL/min (ref >60)
Glucose, Bld: 142 mg/dL — ABNORMAL HIGH (ref 70–99)
Potassium: 3.6 mmol/L (ref 3.5–5.1)
Sodium: 137 mmol/L (ref 135–145)
Total Bilirubin: 0.8 mg/dL (ref 0.3–1.2)
Total Protein: 7.7 g/dL (ref 6.5–8.1)

## 2021-05-06 LAB — LIPASE, BLOOD: Lipase: 20 U/L (ref 11–51)

## 2021-05-06 MED ORDER — SODIUM CHLORIDE 0.9 % IV BOLUS
1000.0000 mL | Freq: Once | INTRAVENOUS | Status: AC
  Administered 2021-05-06: 1000 mL via INTRAVENOUS

## 2021-05-06 NOTE — ED Notes (Signed)
Yesterday began having abd pain and diarrhea, states awoke this am and went to take a shower and passed out. Able to tolerate PO fluids without difficulty.

## 2021-05-06 NOTE — ED Triage Notes (Signed)
Pt c/o abd pain and diarrhea started yesterday-states he passed out at home today-NAD-steady gait

## 2021-05-06 NOTE — ED Provider Notes (Signed)
[facility] EMERGENCY DEPARTMENT Provider Note   CSN: [id number] Arrival date & time: 05/06/21  1519     History Chief Complaint  Patient presents with   Abdominal Pain   Loss of Consciousness    Danny Davenport is a 25 y.o. male.  25 year old male with past medical history of hyperlipidemia, hypertension and prediabetes presents with complaint of abdominal pain with diarrhea.  Patient reports onset of symptoms yesterday, reports numerous episodes of loose nonbloody stools with epigastric discomfort.  Denies nausea, vomiting, sick contacts.  Patient states that he was going to take a shower today and he started to feel weak and lightheaded so he went to his bed to lay down when he passed out lying on the floor.  He denies any injuries from this event.  States he was out for a few seconds before coming to and was able to stand up on his own afterwards.  Patient feels overall as of his symptoms have improved as of this point, less diarrhea today.      Past Medical History:  Diagnosis Date   Asthma    Hypercholesteremia    Hypertension    Prediabetes     Patient Active Problem List   Diagnosis Date Noted   Chest pain 02/04/2014   Abnormal chest x-ray 02/04/2014    Past Surgical History:  Procedure Laterality Date   APPENDECTOMY     FOOT SURGERY         Family History  Problem Relation Age of Onset   Hypertension Maternal Grandmother    Atrial fibrillation Maternal Grandmother     Social History   Tobacco Use   Smoking status: Never   Smokeless tobacco: Never  Vaping Use   Vaping Use: Never used  Substance Use Topics   Alcohol use: No   Drug use: No    Home Medications Prior to Admission medications   Medication Sig Start Date End Date Taking? Authorizing Provider  diazepam (VALIUM) 5 MG tablet Take 1 tablet (5 mg total) by mouth every 12 (twelve) hours as needed for muscle spasms. 04/14/16   Lyndal Pulley, MD    Allergies    Patient has no  known allergies.  Review of Systems   Review of Systems  Constitutional:  Negative for chills, diaphoresis and fever.  Respiratory:  Negative for shortness of breath.   Cardiovascular:  Negative for chest pain.  Gastrointestinal:  Positive for abdominal pain and diarrhea. Negative for blood in stool, constipation, nausea and vomiting.  Genitourinary:  Negative for difficulty urinating.  Musculoskeletal:  Negative for arthralgias and myalgias.  Skin:  Negative for rash and wound.  Allergic/Immunologic: Negative for immunocompromised state.  Neurological:  Positive for syncope, weakness and light-headedness.  Psychiatric/Behavioral:  Negative for confusion.   All other systems reviewed and are negative.  Physical Exam Updated Vital Signs BP 137/63   Pulse 89   Temp 99.1 F (37.3 C) (Oral)   Resp 19   Ht 6\' 1"  (1.854 m)   Wt (!) 140.6 kg   SpO2 100%   BMI 40.90 kg/m   Physical Exam Vitals and nursing note reviewed.  Constitutional:      General: He is not in acute distress.    Appearance: He is well-developed. He is obese. He is not diaphoretic.  HENT:     Head: Normocephalic and atraumatic.  Cardiovascular:     Rate and Rhythm: Normal rate and regular rhythm.     Heart sounds: Normal heart sounds.  Pulmonary:     Effort: Pulmonary effort is normal.     Breath sounds: Normal breath sounds.  Abdominal:     Palpations: Abdomen is soft.     Tenderness: There is no abdominal tenderness.  Skin:    General: Skin is warm and dry.     Findings: No erythema or rash.  Neurological:     Mental Status: He is alert and oriented to person, place, and time.  Psychiatric:        Behavior: Behavior normal.    ED Results / Procedures / Treatments   Labs (all labs ordered are listed, but only abnormal results are displayed) Labs Reviewed  COMPREHENSIVE METABOLIC PANEL - Abnormal; Notable for the following components:      Result Value   Glucose, Bld 142 (*)    Calcium 8.8 (*)     All other components within normal limits  URINALYSIS, ROUTINE W REFLEX MICROSCOPIC - Abnormal; Notable for the following components:   Color, Urine AMBER (*)    All other components within normal limits  LIPASE, BLOOD  CBC    EKG None  Radiology No results found.  Procedures Procedures   Medications Ordered in ED Medications  sodium chloride 0.9 % bolus 1,000 mL (1,000 mLs Intravenous New Bag/Given 05/06/21 1833)  sodium chloride 0.9 % bolus 1,000 mL (0 mLs Intravenous Stopped 05/06/21 1925)    ED Course  I have reviewed the triage vital signs and the nursing notes.  Pertinent labs & imaging results that were available during my care of the patient were reviewed by me and considered in my medical decision making (see chart for details).  Clinical Course as of 05/06/21 1926  Thu May 06, 2021  [id number] 25 year old male presents for evaluation after multiple episodes of diarrhea yesterday into today with a syncopal event preceded by feeling weak and lightheaded.  No injuries as a result of his syncopal event. Patient is well-appearing on exam.  Exam is unremarkable, abdomen is soft and nontender.  Moves extremities with purpose.  CBC within normal months, urinalysis unremarkable, lipase normal.  CMP with glucose of 142, does have history of prediabetes, electrolytes otherwise normal.  Vitals reviewed, tachycardic on arrival, has improved with IV fluids.  Has room air O2 sat of 90%. Status assessed after 1 L normal saline.  Patient's heart rate increases however not greater than 20 beats.  Reports feeling better but still not great.  Given second liter IV fluids, on recheck reports feeling better.  Plan is for discharge with PCP follow-up and return to ER precautions. EKG reviewed, normal sinus rhythm, no segment changes. [LM]    Clinical Course User Index [LM] Alden Hipp   MDM Rules/Calculators/A&P                           Final Clinical Impression(s) / ED  Diagnoses Final diagnoses:  Epigastric pain  Diarrhea, unspecified type  Syncope and collapse    Rx / DC Orders ED Discharge Orders     None        Alden Hipp 05/06/21 1926    Charlynne Pander, MD 05/12/21 2129

## 2021-05-06 NOTE — Discharge Instructions (Addendum)
Home to rest.  Recommend clear liquid diet, advance light as tolerated.  Imodium or Pepto-Bismol if needed for further diarrhea.  Recheck with primary care provider, referral given if needed.

## 2021-05-06 NOTE — ED Notes (Signed)
Sprite given for po challenge. 

## 2021-05-06 NOTE — ED Notes (Signed)
NS IVF bolus initiated
